# Patient Record
Sex: Male | Born: 2000 | Race: Black or African American | Hispanic: No | Marital: Single | Smoking: Never smoker
Health system: Southern US, Community
[De-identification: ages and names within clinical notes are randomized; demographics above are authoritative.]

## PROBLEM LIST (undated history)

## (undated) HISTORY — PX: KNEE SURGERY: SHX244

---

## 2020-11-23 ENCOUNTER — Ambulatory Visit
Admission: EM | Admit: 2020-11-23 | Discharge: 2020-11-23 | Disposition: A | Discharge status: Home / Self Care | Payer: Medicaid Other | Attending: Emergency Medicine | Admitting: Emergency Medicine

## 2020-11-23 ENCOUNTER — Other Ambulatory Visit: Payer: Self-pay

## 2020-11-23 ENCOUNTER — Ambulatory Visit
Admission: RE | Admit: 2020-11-23 | Discharge: 2020-11-23 | Disposition: A | Discharge status: Home / Self Care | Payer: Medicaid Other | Source: Ambulatory Visit

## 2020-11-23 DIAGNOSIS — J069 Acute upper respiratory infection, unspecified: Secondary | ICD-10-CM

## 2020-11-23 DIAGNOSIS — J029 Acute pharyngitis, unspecified: Secondary | ICD-10-CM | POA: Diagnosis not present

## 2020-11-23 DIAGNOSIS — R0689 Other abnormalities of breathing: Secondary | ICD-10-CM

## 2020-11-23 DIAGNOSIS — J3089 Other allergic rhinitis: Secondary | ICD-10-CM

## 2020-11-23 DIAGNOSIS — R058 Other specified cough: Secondary | ICD-10-CM

## 2020-11-23 MED ORDER — AZITHROMYCIN 250 MG PO TABS: ORAL_TABLET | ORAL | 0 refills | Status: DC

## 2020-11-23 NOTE — ED Provider Notes (Signed)
[facility]    CSN: [id number] Arrival date & time: 11/23/20  1331      History   Chief Complaint No chief complaint on file.   HPI Lawrence Brewer is a 20 y.o. male.   Patient complains of a dry cough and headaches for the past 3 days, states he took a home COVID test yesterday which was negative.  The history is provided by the patient.   History reviewed. No pertinent past medical history.  There are no problems to display for this patient.   History reviewed. No pertinent surgical history.     Home Medications    Prior to Admission medications   Medication Sig Start Date End Date Taking? Authorizing Provider  azithromycin (ZITHROMAX) 250 MG tablet Take 2 tablets today, take 1 tablet every day thereafter until complete. 11/23/20  Yes Theadora Rama Scales, PA-C    Family History No family history on file.  Social History Social History   Tobacco Use   Smoking status: Never   Smokeless tobacco: Never  Vaping Use   Vaping Use: Every day  Substance Use Topics   Alcohol use: Never   Drug use: Never     Allergies   Patient has no allergy information on record.   Review of Systems Review of Systems Pertinent findings noted in history of present illness.    Physical Exam Triage Vital Signs ED Triage Vitals  Enc Vitals Group     BP 11/17/20 0827 (!) 147/82     Pulse Rate 11/17/20 0827 72     Resp 11/17/20 0827 18     Temp 11/17/20 0827 98.3 F (36.8 C)     Temp Source 11/17/20 0827 Oral     SpO2 11/17/20 0827 98 %     Weight --      Height --      Head Circumference --      Peak Flow --      Pain Score 11/17/20 0826 5     Pain Loc --      Pain Edu? --      Excl. in GC? --    No data found.  Updated Vital Signs BP 134/85 (BP Location: Right Arm)   Pulse 100   Temp 98.3 F (36.8 C) (Oral)   Resp 18   SpO2 97%   Visual Acuity Right Eye Distance:   Left Eye Distance:   Bilateral Distance:    Right Eye Near:   Left Eye  Near:    Bilateral Near:     Physical Exam Vitals and nursing note reviewed.  Constitutional:      General: He is not in acute distress.    Appearance: Normal appearance. He is not ill-appearing.  HENT:     Head: Normocephalic and atraumatic.     Salivary Glands: Right salivary gland is not diffusely enlarged or tender. Left salivary gland is not diffusely enlarged or tender.     Right Ear: Tympanic membrane, ear canal and external ear normal. No drainage. No middle ear effusion. There is no impacted cerumen. Tympanic membrane is not erythematous or bulging.     Left Ear: Tympanic membrane, ear canal and external ear normal. No drainage.  No middle ear effusion. There is no impacted cerumen. Tympanic membrane is not erythematous or bulging.     Nose: Congestion and rhinorrhea present. No nasal deformity, septal deviation or mucosal edema. Rhinorrhea is clear.     Right Turbinates: Enlarged and swollen. Not pale.  Left Turbinates: Enlarged and swollen. Not pale.     Right Sinus: No maxillary sinus tenderness or frontal sinus tenderness.     Left Sinus: No maxillary sinus tenderness or frontal sinus tenderness.     Mouth/Throat:     Lips: Pink. No lesions.     Mouth: Mucous membranes are moist. No oral lesions.     Pharynx: Oropharynx is clear. Uvula midline. No posterior oropharyngeal erythema or uvula swelling.     Tonsils: No tonsillar exudate. 0 on the right. 0 on the left.  Eyes:     General: Lids are normal.        Right eye: No discharge.        Left eye: No discharge.     Extraocular Movements: Extraocular movements intact.     Conjunctiva/sclera: Conjunctivae normal.     Right eye: Right conjunctiva is not injected.     Left eye: Left conjunctiva is not injected.  Neck:     Trachea: Trachea and phonation normal.  Cardiovascular:     Rate and Rhythm: Normal rate and regular rhythm.     Pulses: Normal pulses.     Heart sounds: Normal heart sounds. No murmur heard.   No  friction rub. No gallop.  Pulmonary:     Effort: Pulmonary effort is normal. No accessory muscle usage, prolonged expiration or respiratory distress.     Breath sounds: No stridor, decreased air movement or transmitted upper airway sounds. Examination of the right-middle field reveals decreased breath sounds. Examination of the right-lower field reveals decreased breath sounds. Decreased breath sounds present. No wheezing, rhonchi or rales.  Chest:     Chest wall: No tenderness.  Musculoskeletal:        General: Normal range of motion.     Cervical back: Normal range of motion and neck supple. Normal range of motion.  Lymphadenopathy:     Cervical: No cervical adenopathy.  Skin:    General: Skin is warm and dry.     Findings: No erythema or rash.  Neurological:     General: No focal deficit present.     Mental Status: He is alert and oriented to person, place, and time.  Psychiatric:        Mood and Affect: Mood normal.        Behavior: Behavior normal.     UC Treatments / Results  Labs (all labs ordered are listed, but only abnormal results are displayed) Labs Reviewed - No data to display  EKG   Radiology No results found.  Procedures Procedures (including critical care time)  Medications Ordered in UC Medications - No data to display  Initial Impression / Assessment and Plan / UC Course  I have reviewed the triage vital signs and the nursing notes.  Pertinent labs & imaging results that were available during my care of the patient were reviewed by me and considered in my medical decision making (see chart for details).     Physical exam findings of decreased breath sounds concerning for possible community-acquired pneumonia.  Recommend that he begin azithromycin now, I will place an order for him to have a chest x-ray and notify him of the results once received.  Patient advised to follow-up early next week if he does not have improvement of his symptoms.  Note  provided for work.  Patient verbalized understanding and agreement of plan as discussed.  All questions were addressed during visit.  Please see discharge instructions below for further details of plan.  Final Clinical Impressions(s) / UC Diagnoses   Final diagnoses:  Acute pharyngitis, unspecified etiology  Decreased breath sounds in middle field on right side of chest  Nonproductive cough  Acute upper respiratory infection  Non-seasonal allergic rhinitis, unspecified trigger     Discharge Instructions      Based on the history you provided and your physical exam findings, I am concerned that you may have walking pneumonia in your right lower lobe.    Please go to the [facility] to have your chest x-ray performed.  Please also go to your pharmacy to pick up your prescription for azithromycin, please take 2 tablets today and then 1 tablet daily until complete.  You will be contacted with the results of your chest x-ray.  If you have not had significant improvement of your symptoms, please return for evaluation on Monday.  If you have worsening symptoms including fever greater than 102, increasing shortness of breath, altered mental status, please go to the emergency room for evaluation and possible treatment.      ED Prescriptions     Medication Sig Dispense Auth. Provider   azithromycin (ZITHROMAX) 250 MG tablet Take 2 tablets today, take 1 tablet every day thereafter until complete. 6 each Theadora Rama Scales, PA-C      PDMP not reviewed this encounter.    Theadora Rama Scales, PA-C 11/23/20 1517

## 2020-11-23 NOTE — Discharge Instructions (Addendum)
Based on the history you provided and your physical exam findings, I am concerned that you may have walking pneumonia in your right lower lobe.    Please go to the [facility] at [facility] to have your chest x-ray performed.  Please also go to your pharmacy to pick up your prescription for azithromycin, please take 2 tablets today and then 1 tablet daily until complete.  You will be contacted with the results of your chest x-ray.  If you have not had significant improvement of your symptoms, please return for evaluation on Monday.  If you have worsening symptoms including fever greater than 102, increasing shortness of breath, altered mental status, please go to the emergency room for evaluation and possible treatment.

## 2020-11-23 NOTE — ED Triage Notes (Signed)
Pt reports of dry cough and headaches for 3 days.  Pt states he took a Covid (at home test) yesterday that was negative.

## 2020-11-24 ENCOUNTER — Ambulatory Visit: Payer: Self-pay

## 2020-11-24 ENCOUNTER — Telehealth: Payer: Self-pay | Admitting: Emergency Medicine

## 2020-11-24 ENCOUNTER — Ambulatory Visit (INDEPENDENT_AMBULATORY_CARE_PROVIDER_SITE_OTHER): Payer: Medicaid Other

## 2020-11-24 ENCOUNTER — Other Ambulatory Visit: Payer: Self-pay | Admitting: Emergency Medicine

## 2020-11-24 DIAGNOSIS — R051 Acute cough: Secondary | ICD-10-CM

## 2020-11-24 NOTE — Telephone Encounter (Signed)
Call to [facility] to let him know the Xray is  supposed to be at [facility]. An appointment is not needed with the Urgent  Care in [place]. RN left a # for call back & questions

## 2021-04-10 ENCOUNTER — Other Ambulatory Visit: Payer: Self-pay

## 2021-04-10 ENCOUNTER — Ambulatory Visit
Admission: RE | Admit: 2021-04-10 | Discharge: 2021-04-10 | Disposition: A | Discharge status: Home / Self Care | Payer: Medicaid Other | Source: Ambulatory Visit | Attending: Emergency Medicine | Admitting: Emergency Medicine

## 2021-04-10 VITALS — BP 142/80 | HR 94 | Temp 99.3°F | Resp 16

## 2021-04-10 DIAGNOSIS — R110 Nausea: Secondary | ICD-10-CM

## 2021-04-10 DIAGNOSIS — Z113 Encounter for screening for infections with a predominantly sexual mode of transmission: Secondary | ICD-10-CM | POA: Diagnosis present

## 2021-04-10 DIAGNOSIS — R300 Dysuria: Secondary | ICD-10-CM

## 2021-04-10 DIAGNOSIS — L918 Other hypertrophic disorders of the skin: Secondary | ICD-10-CM

## 2021-04-10 DIAGNOSIS — R3915 Urgency of urination: Secondary | ICD-10-CM | POA: Diagnosis present

## 2021-04-10 MED ORDER — CEFTRIAXONE SODIUM 500 MG IJ SOLR
500.0000 mg | Freq: Once | INTRAMUSCULAR | Status: AC
  Administered 2021-04-10: 500 mg via INTRAMUSCULAR

## 2021-04-10 NOTE — ED Provider Notes (Signed)
?[facility] ? ? ? ?CSN: [id number] ?Arrival date & time: 04/10/21  8527 ?  ? ?HISTORY  ?No chief complaint on file. ? ?HPI ?Lawrence Brewer is a 21 y.o. male. Pt requesting STD check. States having pressure and urgent to urinate more frequently.  Patient states he is also felt a little bit nauseated for the past few days.  Patient states his girlfriend is at the health department today getting screening for STDs, states he has never been screened for. Denies penile discharge, genital lesion, pelvic pain, lower back pain, flank pain, fever, chills known STD exposure.   Pt concern of a bump on at his right inguinal fold for over a year. States has had it checked before and was told it was benign but states that it is now is larger, itchy, and irritating.  ? ?The history is provided by the patient.  ?History reviewed. No pertinent past medical history. ?There are no problems to display for this patient. ? ?History reviewed. No pertinent surgical history. ? ?Home Medications   ? ?Prior to Admission medications   ?Not on File  ? ?Family History ?History reviewed. No pertinent family history. ?Social History ?Social History  ? ?Tobacco Use  ? Smoking status: Never  ? Smokeless tobacco: Never  ?Vaping Use  ? Vaping Use: Every day  ?Substance Use Topics  ? Alcohol use: Never  ? Drug use: Never  ? ?Allergies   ?Patient has no known allergies. ? ?Review of Systems ?Review of Systems ?Pertinent findings noted in history of present illness.  ? ?Physical Exam ?Triage Vital Signs ?ED Triage Vitals  ?Enc Vitals Group  ?   BP 11/17/20 0827 (!) 147/82  ?   Pulse Rate 11/17/20 0827 72  ?   Resp 11/17/20 0827 18  ?   Temp 11/17/20 0827 98.3 ?F (36.8 ?C)  ?   Temp Source 11/17/20 0827 Oral  ?   SpO2 11/17/20 0827 98 %  ?   Weight --   ?   Height --   ?   Head Circumference --   ?   Peak Flow --   ?   Pain Score 11/17/20 0826 5  ?   Pain Loc --   ?   Pain Edu? --   ?   Excl. in GC? --   ?No data found. ? ?Updated Vital  Signs ?BP (!) 142/80 (BP Location: Left Arm)   Pulse 94   Temp 99.3 ?F (37.4 ?C) (Oral)   Resp 16   SpO2 96%  ? ?Physical Exam ?Vitals and nursing note reviewed.  ?Constitutional:   ?   General: He is not in acute distress. ?   Appearance: Normal appearance. He is not ill-appearing.  ?HENT:  ?   Head: Normocephalic and atraumatic.  ?Eyes:  ?   General: Lids are normal.     ?   Right eye: No discharge.     ?   Left eye: No discharge.  ?   Extraocular Movements: Extraocular movements intact.  ?   Conjunctiva/sclera: Conjunctivae normal.  ?   Right eye: Right conjunctiva is not injected.  ?   Left eye: Left conjunctiva is not injected.  ?Neck:  ?   Trachea: Trachea and phonation normal.  ?Cardiovascular:  ?   Rate and Rhythm: Normal rate and regular rhythm.  ?   Pulses: Normal pulses.  ?   Heart sounds: Normal heart sounds. No murmur heard. ?  No friction rub. No gallop.  ?  Pulmonary:  ?   Effort: Pulmonary effort is normal. No accessory muscle usage, prolonged expiration or respiratory distress.  ?   Breath sounds: Normal breath sounds. No stridor, decreased air movement or transmitted upper airway sounds. No decreased breath sounds, wheezing, rhonchi or rales.  ?Chest:  ?   Chest wall: No tenderness.  ?Genitourinary: ?   Comments: Pt politely declines GU exam, pt did provide a penile swab for testing.  ? ?Musculoskeletal:     ?   General: Normal range of motion.  ?   Cervical back: Normal range of motion and neck supple. Normal range of motion.  ?Lymphadenopathy:  ?   Cervical: No cervical adenopathy.  ?Skin: ?   General: Skin is warm and dry.  ?   Findings: Lesion (0.75 cm skin tag at right groin medially lateral to base of penis with mild surrounding irritation but no erythema.) present. No erythema or rash.  ?Neurological:  ?   General: No focal deficit present.  ?   Mental Status: He is alert and oriented to person, place, and time.  ?Psychiatric:     ?   Mood and Affect: Mood normal.     ?   Behavior:  Behavior normal.  ? ? ?Visual Acuity ?Right Eye Distance:   ?Left Eye Distance:   ?Bilateral Distance:   ? ?Right Eye Near:   ?Left Eye Near:    ?Bilateral Near:    ? ?UC Couse / Diagnostics / Procedures:  ?  ?EKG ? ?Radiology ?No results found. ? ?Procedures ?Procedures (including critical care time) ? ?UC Diagnoses / Final Clinical Impressions(s)   ?I have reviewed the triage vital signs and the nursing notes. ? ?Pertinent labs & imaging results that were available during my care of the patient were reviewed by me and considered in my medical decision making (see chart for details).   ? ?Final diagnoses:  ?Skin tag  ?Screening examination for STD (sexually transmitted disease)  ?Nausea without vomiting  ?Burning with urination  ?Urinary urgency  ? ?Patient was provided with Ceftriaxone 500 mg IM for empiric treatment of presumed GC based on the history provided to me today. ?  ?Patient was advised to abstain from sexual intercourse for the next 7 days while being treated.  Patient was also advised to use condoms to protect themselves from STD exposure. ?  ?STD screening was performed, patient advised that the results be posted to their MyChart and if any of the results are positive, they will be notified by phone, further treatment will be provided as indicated based on results of STD screening. ?  ?Return precautions advised.  Drug allergies reviewed, all questions addressed.  ? ?Patient advised to follow-up with primary care or dermatology regarding skin tag. ? ?ED Prescriptions   ?None ?  ? ?PDMP not reviewed this encounter. ? ?Pending results:  ?Labs Reviewed  ?CYTOLOGY, (ORAL, ANAL, URETHRAL) ANCILLARY ONLY  ? ? ?Medications Ordered in UC: ?Medications  ?cefTRIAXone (ROCEPHIN) injection 500 mg (has no administration in time range)  ? ? ?Disposition Upon Discharge:  ?Condition: stable for discharge home ? ?Patient presented with concern for an acute illness with associated systemic symptoms and significant  discomfort requiring urgent management. In my opinion, this is a condition that a prudent lay person (someone who possesses an average knowledge of health and medicine) may potentially expect to result in complications if not addressed urgently such as respiratory distress, impairment of bodily function or dysfunction of bodily organs.  ? ?As  such, the patient has been evaluated and assessed, work-up was performed and treatment was provided in alignment with urgent care protocols and evidence based medicine.  Patient/parent/caregiver has been advised that the patient may require follow up for further testing and/or treatment if the symptoms continue in spite of treatment, as clinically indicated and appropriate. ? ?Routine symptom specific, illness specific and/or disease specific instructions were discussed with the patient and/or caregiver at length.  Prevention strategies for avoiding STD exposure were also discussed. ? ?The patient will follow up with their current PCP if and as advised. If the patient does not currently have a PCP we will assist them in obtaining one.  ? ?The patient may need specialty follow up if the symptoms continue, in spite of conservative treatment and management, for further workup, evaluation, consultation and treatment as clinically indicated and appropriate. ? ?Patient/parent/caregiver verbalized understanding and agreement of plan as discussed.  All questions were addressed during visit.  Please see discharge instructions below for further details of plan. ? ?Discharge Instructions: ? ? ?Discharge Instructions   ? ?  ?The lesion on the right side of your groin is a common skin tag.  Skin tags appear in areas where there is friction such as under the arms or at the base of the neck or, in your case, where the skin folds between your leg and your abdomen.  Skin tags can grow but have no risk of cancer.  If the area is bothering you or becomes infected, you can certainly see your  primary care provider and/or dermatologist to have it removed.  Removal is a safe procedure, complications are limited to risk of skin infection. ? ?Based on the history you provided to me today, you were treated empirically f

## 2021-04-10 NOTE — ED Triage Notes (Signed)
Pt requesting STD check. States having pressure and urgent to urinate at times. Denies penile discharge.  ? ?Pt concern of a bump to groin area for over a year. States has had to checked but now is larger,.itchy, and irritating.  ?

## 2021-04-10 NOTE — Discharge Instructions (Addendum)
The lesion on the right side of your groin is a common skin tag.  Skin tags appear in areas where there is friction such as under the arms or at the base of the neck or, in your case, where the skin folds between your leg and your abdomen.  Skin tags can grow but have no risk of cancer.  If the area is bothering you or becomes infected, you can certainly see your primary care provider and/or dermatologist to have it removed.  Removal is a safe procedure, complications are limited to risk of skin infection. ? ?Based on the history you provided to me today, you were treated empirically for possible gonorrhea with an injection of ceftriaxone 500 mg.  This is the only treatment you will need for gonorrhea.  Please abstain from sexual intercourse for 7 days.  If your gonorrhea test ends up being negative, this antibiotic will not cause you any harm. ?  ?The results of your STD testing today will be made available to you once they are complete, this typically takes 3 to 5 days.  They will initially be posted to your MyChart and, if any of your results are abnormal, you will receive a phone call with those results along with further instructions regarding any further treatment, if needed.  ?  ?Please remember that the only way to prevent transmission of sexually transmitted disease when having sexual intercourse is to use condoms.  Repeat sexually transmitted infections can cause scarring of the tubes that carry sperm from your testicles to your penis during ejaculation.  This can interfere with your your ability to have children.  Repeat exposures to sexually transmitted diseases can also increase your risk of human papilloma virus which causes genital warts and HIV. ?  ?If you have not had complete resolution of your symptoms after completing treatment, please return for repeat evaluation. ?  ?Thank you for visiting urgent care today.  I appreciate the opportunity to participate in your care.  It was nice to see you  again. ? ?

## 2021-04-11 LAB — CYTOLOGY, (ORAL, ANAL, URETHRAL) ANCILLARY ONLY
Chlamydia: NEGATIVE
Comment: NEGATIVE
Comment: NEGATIVE
Comment: NORMAL
Neisseria Gonorrhea: NEGATIVE
Trichomonas: NEGATIVE

## 2021-06-09 ENCOUNTER — Ambulatory Visit
Admission: RE | Admit: 2021-06-09 | Discharge: 2021-06-09 | Disposition: A | Discharge status: Home / Self Care | Payer: Medicaid Other | Source: Ambulatory Visit | Attending: Emergency Medicine | Admitting: Emergency Medicine

## 2021-06-09 VITALS — BP 141/87 | HR 98 | Temp 99.8°F | Resp 20

## 2021-06-09 DIAGNOSIS — Z113 Encounter for screening for infections with a predominantly sexual mode of transmission: Secondary | ICD-10-CM | POA: Insufficient documentation

## 2021-06-09 DIAGNOSIS — R360 Urethral discharge without blood: Secondary | ICD-10-CM | POA: Diagnosis present

## 2021-06-09 NOTE — ED Provider Notes (Signed)
[facility]    CSN: [id number] Arrival date & time: 06/09/21  1442    HISTORY   Chief Complaint  Patient presents with   Penile Discharge   Rash   HPI Lawrence Brewer is a 21 y.o. male. Patient presents to urgent care complaining of waking up in the morning with a cloudy discharge that is different in appearance from his usual morning emission which is usually clear.  Patient denies burning with urination, testicular pain or swelling.  Patient states he is in a monogamous relationship with 1 male partner but that she is not his first partner.  Patient denies history of STD.  Patient also complains of a small white bump on the anterior aspect of his penile shaft that is not painful, is mobile, nonfluctuant.  Patient states is not red and does not itch.  Patient states he has never had a Pap at this before.  Patient states has been present about 3 to 4 days.  The history is provided by the patient.  Rash History reviewed. No pertinent past medical history. There are no problems to display for this patient.  History reviewed. No pertinent surgical history.  Home Medications    Prior to Admission medications   Not on File   Family History Family History  Family history unknown: Yes   Social History Social History   Tobacco Use   Smoking status: Never   Smokeless tobacco: Never  Vaping Use   Vaping Use: Every day  Substance Use Topics   Alcohol use: Yes    Comment: social   Drug use: Never   Allergies   Patient has no known allergies.  Review of Systems Review of Systems Pertinent findings noted in history of present illness.   Physical Exam Triage Vital Signs ED Triage Vitals  Enc Vitals Group     BP 11/17/20 0827 (!) 147/82     Pulse Rate 11/17/20 0827 72     Resp 11/17/20 0827 18     Temp 11/17/20 0827 98.3 F (36.8 C)     Temp Source 11/17/20 0827 Oral     SpO2 11/17/20 0827 98 %     Weight --      Height --      Head Circumference --       Peak Flow --      Pain Score 11/17/20 0826 5     Pain Loc --      Pain Edu? --      Excl. in GC? --   No data found.  Updated Vital Signs BP (!) 141/87   Pulse 98   Temp 99.8 F (37.7 C)   Resp 20   SpO2 98%   Physical Exam Vitals and nursing note reviewed.  Constitutional:      General: He is not in acute distress.    Appearance: Normal appearance. He is not ill-appearing.  HENT:     Head: Normocephalic and atraumatic.  Eyes:     General: Lids are normal.        Right eye: No discharge.        Left eye: No discharge.     Extraocular Movements: Extraocular movements intact.     Conjunctiva/sclera: Conjunctivae normal.     Right eye: Right conjunctiva is not injected.     Left eye: Left conjunctiva is not injected.  Neck:     Trachea: Trachea and phonation normal.  Cardiovascular:     Rate and Rhythm: Normal rate and  regular rhythm.     Pulses: Normal pulses.     Heart sounds: Normal heart sounds. No murmur heard.   No friction rub. No gallop.  Pulmonary:     Effort: Pulmonary effort is normal. No accessory muscle usage, prolonged expiration or respiratory distress.     Breath sounds: Normal breath sounds. No stridor, decreased air movement or transmitted upper airway sounds. No decreased breath sounds, wheezing, rhonchi or rales.  Chest:     Chest wall: No tenderness.  Genitourinary:    Comments: Patient provided a penile swab for testing.  1 large [place]ce spot on shaft of penis without surrounding erythema.  Musculoskeletal:        General: Normal range of motion.     Cervical back: Normal range of motion and neck supple. Normal range of motion.  Lymphadenopathy:     Cervical: No cervical adenopathy.  Skin:    General: Skin is warm and dry.     Findings: No erythema or rash.  Neurological:     General: No focal deficit present.     Mental Status: He is alert and oriented to person, place, and time.  Psychiatric:        Mood and Affect: Mood normal.         Behavior: Behavior normal.    Visual Acuity Right Eye Distance:   Left Eye Distance:   Bilateral Distance:    Right Eye Near:   Left Eye Near:    Bilateral Near:     UC Couse / Diagnostics / Procedures:    EKG  Radiology No results found.  Procedures Procedures (including critical care time)  UC Diagnoses / Final Clinical Impressions(s)   I have reviewed the triage vital signs and the nursing notes.  Pertinent labs & imaging results that were available during my care of the patient were reviewed by me and considered in my medical decision making (see chart for details).    Final diagnoses:  Screening examination for STD (sexually transmitted disease)  Abnormal penile discharge, without blood   STD screening was performed, patient advised that the results be posted to their MyChart and if any of the results are positive, they will be notified by phone, further treatment will be provided as indicated based on results of STD screening.   Return precautions advised.  Drug allergies reviewed, all questions addressed.     ED Prescriptions   None    PDMP not reviewed this encounter.  Pending results:  Labs Reviewed  CYTOLOGY, (ORAL, ANAL, URETHRAL) ANCILLARY ONLY    Medications Ordered in UC: Medications - No data to display  Disposition Upon Discharge:  Condition: stable for discharge home  Patient presented with concern for an acute illness with associated systemic symptoms and significant discomfort requiring urgent management. In my opinion, this is a condition that a prudent lay person (someone who possesses an average knowledge of health and medicine) may potentially expect to result in complications if not addressed urgently such as respiratory distress, impairment of bodily function or dysfunction of bodily organs.   As such, the patient has been evaluated and assessed, work-up was performed and treatment was provided in alignment with urgent care protocols  and evidence based medicine.  Patient/parent/caregiver has been advised that the patient may require follow up for further testing and/or treatment if the symptoms continue in spite of treatment, as clinically indicated and appropriate.  Routine symptom specific, illness specific and/or disease specific instructions were discussed with the patient and/or caregiver  at length.  Prevention strategies for avoiding STD exposure were also discussed.  The patient will follow up with their current PCP if and as advised. If the patient does not currently have a PCP we will assist them in obtaining one.   The patient may need specialty follow up if the symptoms continue, in spite of conservative treatment and management, for further workup, evaluation, consultation and treatment as clinically indicated and appropriate.  Patient/parent/caregiver verbalized understanding and agreement of plan as discussed.  All questions were addressed during visit.  Please see discharge instructions below for further details of plan.  Discharge Instructions:   Discharge Instructions      The results of your STD testing today will be made available to you once they are complete, this typically takes 3 to 5 days.  They will initially be posted to your MyChart and, if any of your results are abnormal, you will receive a phone call with those results along with further instructions regarding treatment and any prescriptions, if needed.    As we discussed, please, please, please keep in mind that chlamydia can hide and be passed between multiple partners for years without any of the carriers knowing because it never has any symptoms.  If any of your test results are positive, please do not use this is a reason to and what is otherwise a happy and clearly special relationship.  Thank you for visiting urgent care today.  I appreciate the opportunity to participate in your care.     This office note has been dictated using  Teaching laboratory technicianDragon speech recognition software.  Unfortunately, and despite my best efforts, this method of dictation can sometimes lead to occasional typographical or grammatical errors.  I apologize in advance if this occurs.      Theadora RamaMorgan, Makalya Nave Scales, PA-C 06/09/21 1546

## 2021-06-09 NOTE — Discharge Instructions (Signed)
The results of your STD testing today will be made available to you once they are complete, this typically takes 3 to 5 days.  They will initially be posted to your MyChart and, if any of your results are abnormal, you will receive a phone call with those results along with further instructions regarding treatment and any prescriptions, if needed.    As we discussed, please, please, please keep in mind that chlamydia can hide and be passed between multiple partners for years without any of the carriers knowing because it never has any symptoms.  If any of your test results are positive, please do not use this is a reason to and what is otherwise a happy and clearly special relationship.  Thank you for visiting urgent care today.  I appreciate the opportunity to participate in your care.

## 2021-06-09 NOTE — ED Triage Notes (Addendum)
Pt states that he has a bump at the base of his penis and penile discharge x 1 week. Pt reports discharge only while aroused or when first waking up. Discharge is cloudy/clear to white and is a small amount.

## 2021-06-12 LAB — CYTOLOGY, (ORAL, ANAL, URETHRAL) ANCILLARY ONLY
Chlamydia: NEGATIVE
Comment: NEGATIVE
Comment: NEGATIVE
Comment: NORMAL
Neisseria Gonorrhea: NEGATIVE
Trichomonas: NEGATIVE

## 2022-02-22 ENCOUNTER — Ambulatory Visit
Admission: EM | Admit: 2022-02-22 | Discharge: 2022-02-22 | Disposition: A | Discharge status: Home / Self Care | Payer: Medicaid Other | Attending: Urgent Care | Admitting: Urgent Care

## 2022-02-22 DIAGNOSIS — R369 Urethral discharge, unspecified: Secondary | ICD-10-CM | POA: Insufficient documentation

## 2022-02-22 DIAGNOSIS — N342 Other urethritis: Secondary | ICD-10-CM | POA: Diagnosis not present

## 2022-02-22 MED ORDER — CEFTRIAXONE SODIUM 500 MG IJ SOLR
500.0000 mg | INTRAMUSCULAR | Status: DC
  Administered 2022-02-22: 500 mg via INTRAMUSCULAR

## 2022-02-22 MED ORDER — DOXYCYCLINE HYCLATE 100 MG PO CAPS: 100.0000 mg | ORAL_CAPSULE | Freq: Two times a day (BID) | ORAL | 0 refills | Status: AC

## 2022-02-22 NOTE — ED Provider Notes (Signed)
  [facility]  Note:  This document was prepared using Systems analyst and may include unintentional dictation errors.  MRN: [id number] DOB: August 07, 2000  Subjective:   Lawrence Brewer is a 22 y.o. male presenting for 1 week history of penile discharge, dysuria. Has unprotected sex with 78 male partner.   No current facility-administered medications for this encounter. No current outpatient medications on file.   Allergies  Allergen Reactions   Coconut (Cocos Nucifera)     History reviewed. No pertinent past medical history.   Past Surgical History:  Procedure Laterality Date   KNEE SURGERY      Family History  Family history unknown: Yes    Social History   Tobacco Use   Smoking status: Never   Smokeless tobacco: Never  Vaping Use   Vaping Use: Every day  Substance Use Topics   Alcohol use: Not Currently   Drug use: Never    ROS   Objective:   Vitals: BP (!) 146/88 (BP Location: Right Arm)   Pulse 83   Temp 99 F (37.2 C) (Oral)   Resp 18   SpO2 99%   Physical Exam Constitutional:      General: He is not in acute distress.    Appearance: Normal appearance. He is well-developed and normal weight. He is not ill-appearing, toxic-appearing or diaphoretic.  HENT:     Head: Normocephalic and atraumatic.     Right Ear: External ear normal.     Left Ear: External ear normal.     Nose: Nose normal.     Mouth/Throat:     Pharynx: Oropharynx is clear.  Eyes:     General: No scleral icterus.       Right eye: No discharge.        Left eye: No discharge.     Extraocular Movements: Extraocular movements intact.  Cardiovascular:     Rate and Rhythm: Normal rate.  Pulmonary:     Effort: Pulmonary effort is normal.  Genitourinary:    Penis: Circumcised. Discharge present. No phimosis, paraphimosis, hypospadias, erythema, tenderness, swelling or lesions.   Musculoskeletal:     Cervical back: Normal range of motion.   Neurological:     Mental Status: He is alert and oriented to person, place, and time.  Psychiatric:        Mood and Affect: Mood normal.        Behavior: Behavior normal.        Thought Content: Thought content normal.        Judgment: Judgment normal.    IM ceftriaxone 500mg  in clinic.   Assessment and Plan :   PDMP not reviewed this encounter.  1. Urethritis   2. Penile discharge     Patient treated empirically as per CDC guidelines with IM ceftriaxone, doxycycline as an outpatient.  Labs pending.   Counseled on safe sex practices including abstaining for 1 week following treatment.  Counseled patient on potential for adverse effects with medications prescribed/recommended today, ER and return-to-clinic precautions discussed, patient verbalized understanding.    Jaynee Eagles, PA-C 02/22/22 1320

## 2022-02-22 NOTE — ED Triage Notes (Addendum)
Pt c/o penile d/c x 1 week, dysuria x 4 days-NAD-steady gait

## 2022-02-22 NOTE — Discharge Instructions (Addendum)
Avoid all forms of sexual intercourse (oral, vaginal, anal) for the next 7 days to avoid spreading/reinfecting or at least until we can see what kinds of infection results are positive.  Abstaining for 2 weeks would be better but at least 1 week is required.  We will let you know about your test results from the swab we did today and if you need any prescriptions for antibiotics or changes to your treatment from today.  

## 2022-02-25 LAB — CYTOLOGY, (ORAL, ANAL, URETHRAL) ANCILLARY ONLY
Chlamydia: NEGATIVE
Comment: NEGATIVE
Comment: NEGATIVE
Comment: NORMAL
Neisseria Gonorrhea: NEGATIVE
Trichomonas: NEGATIVE

## 2023-06-15 IMAGING — DX DG CHEST 2V
2 series · 2 of 2 positions shown · non-contrast
Comparison: None.

CLINICAL DATA: Decreased breath sounds on the right.  Cough.

EXAM:
CHEST - 2 VIEW

[chest pa]
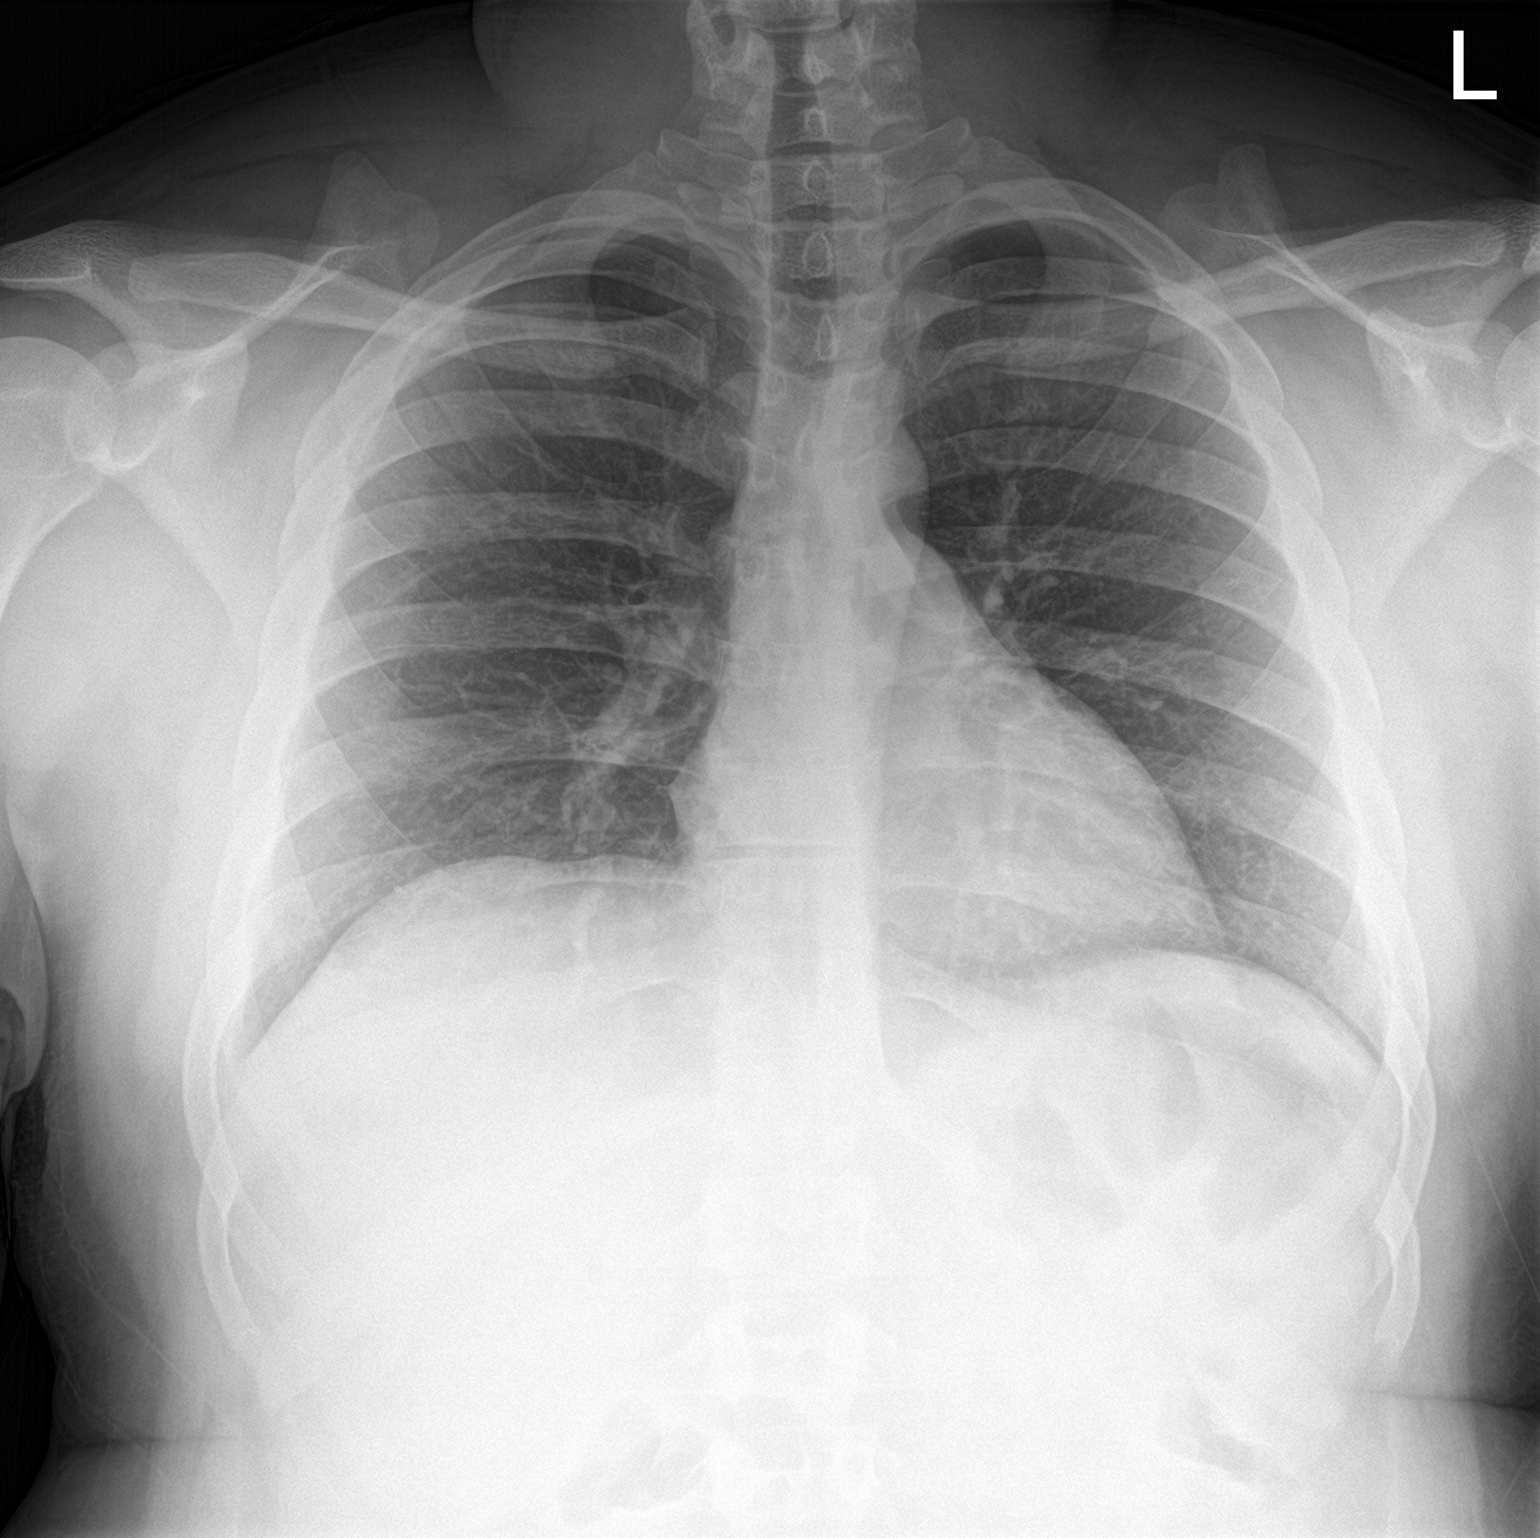

[chest lat]
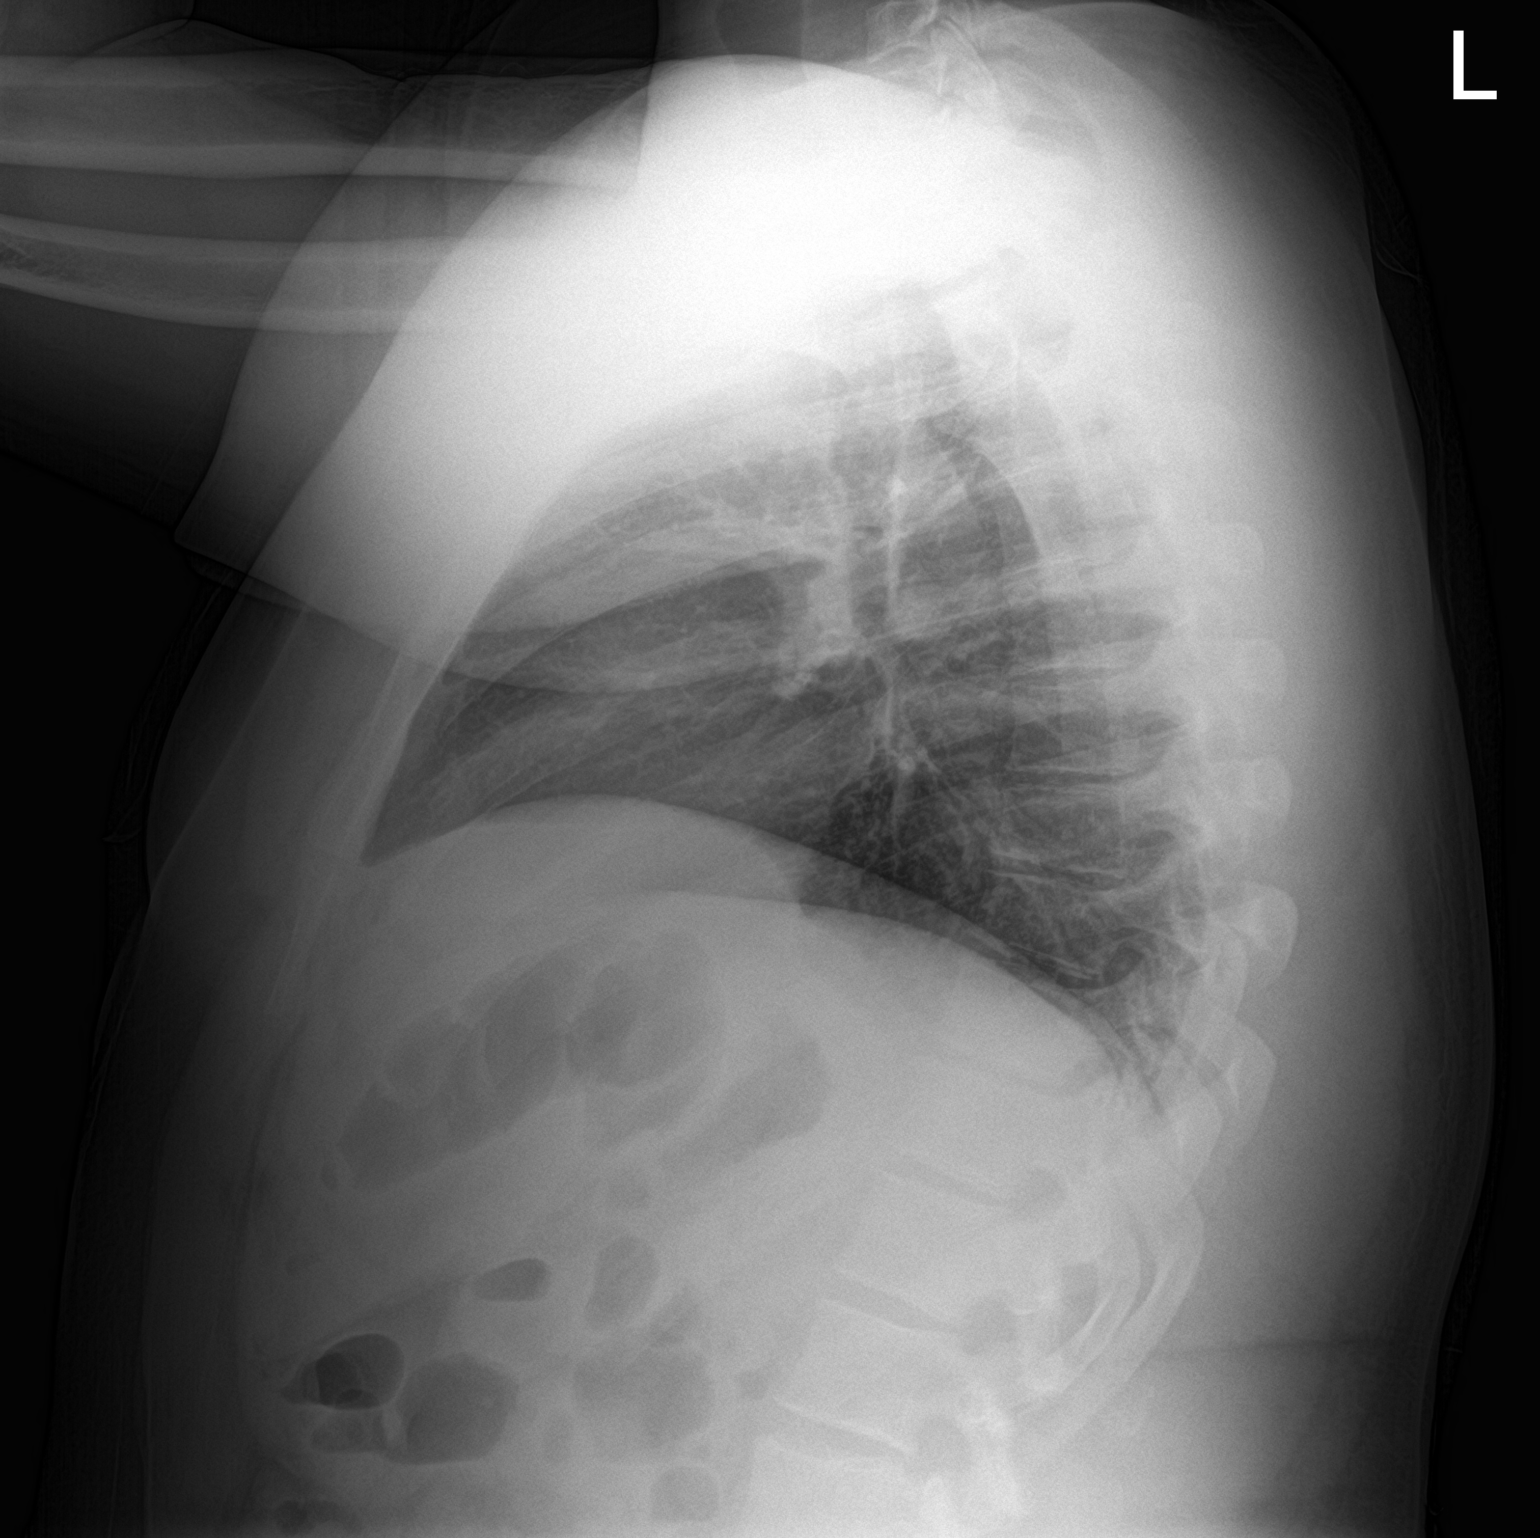

[2 of 2 positions shown; findings below may reference images not displayed]

FINDINGS: The heart size and mediastinal contours are within normal limits.
Both lungs are clear. The visualized skeletal structures are
unremarkable.
IMPRESSION: No active cardiopulmonary disease.
